# Patient Record
Sex: Male | Born: 1965 | Race: Black or African American | Hispanic: No | Marital: Single | Smoking: Never smoker
Health system: Southern US, Community
[De-identification: ages and names within clinical notes are randomized; demographics above are authoritative.]

## PROBLEM LIST (undated history)

## (undated) DIAGNOSIS — F1020 Alcohol dependence, uncomplicated: Secondary | ICD-10-CM

## (undated) DIAGNOSIS — I10 Essential (primary) hypertension: Secondary | ICD-10-CM

## (undated) DIAGNOSIS — F419 Anxiety disorder, unspecified: Secondary | ICD-10-CM

## (undated) DIAGNOSIS — F32A Depression, unspecified: Secondary | ICD-10-CM

## (undated) DIAGNOSIS — F329 Major depressive disorder, single episode, unspecified: Secondary | ICD-10-CM

---

## 1999-06-10 ENCOUNTER — Emergency Department (HOSPITAL_COMMUNITY): Admission: EM | Admit: 1999-06-10 | Discharge: 1999-06-10 | Payer: Self-pay | Admitting: Emergency Medicine

## 1999-06-27 ENCOUNTER — Emergency Department (HOSPITAL_COMMUNITY): Admission: EM | Admit: 1999-06-27 | Discharge: 1999-06-27 | Payer: Self-pay | Admitting: Emergency Medicine

## 2000-02-06 ENCOUNTER — Emergency Department (HOSPITAL_COMMUNITY): Admission: EM | Admit: 2000-02-06 | Discharge: 2000-02-06 | Payer: Self-pay | Admitting: Emergency Medicine

## 2012-07-18 ENCOUNTER — Emergency Department (HOSPITAL_COMMUNITY): Payer: Self-pay

## 2012-07-18 ENCOUNTER — Emergency Department (HOSPITAL_COMMUNITY)
Admission: EM | Admit: 2012-07-18 | Discharge: 2012-07-18 | Disposition: A | Discharge status: Home / Self Care | Payer: Self-pay | Attending: Emergency Medicine | Admitting: Emergency Medicine

## 2012-07-18 ENCOUNTER — Encounter (HOSPITAL_COMMUNITY): Payer: Self-pay | Admitting: *Deleted

## 2012-07-18 DIAGNOSIS — Y9239 Other specified sports and athletic area as the place of occurrence of the external cause: Secondary | ICD-10-CM | POA: Insufficient documentation

## 2012-07-18 DIAGNOSIS — F17200 Nicotine dependence, unspecified, uncomplicated: Secondary | ICD-10-CM | POA: Insufficient documentation

## 2012-07-18 DIAGNOSIS — X58XXXA Exposure to other specified factors, initial encounter: Secondary | ICD-10-CM | POA: Insufficient documentation

## 2012-07-18 DIAGNOSIS — Y92838 Other recreation area as the place of occurrence of the external cause: Secondary | ICD-10-CM | POA: Insufficient documentation

## 2012-07-18 DIAGNOSIS — Y9367 Activity, basketball: Secondary | ICD-10-CM | POA: Insufficient documentation

## 2012-07-18 DIAGNOSIS — M25469 Effusion, unspecified knee: Secondary | ICD-10-CM | POA: Insufficient documentation

## 2012-07-18 DIAGNOSIS — M25461 Effusion, right knee: Secondary | ICD-10-CM

## 2012-07-18 LAB — GLUCOSE, CAPILLARY: Glucose-Capillary: 97 mg/dL (ref 70–99)

## 2012-07-18 MED ORDER — OXYCODONE-ACETAMINOPHEN 5-325 MG PO TABS: 1.0000 | ORAL_TABLET | ORAL | Status: DC | PRN

## 2012-07-18 MED ORDER — OXYCODONE-ACETAMINOPHEN 5-325 MG PO TABS
2.0000 | ORAL_TABLET | Freq: Once | ORAL | Status: AC
  Administered 2012-07-18: 2 via ORAL
  Filled 2012-07-18: qty 2

## 2012-07-18 NOTE — Progress Notes (Signed)
Orthopedic Tech Progress Note Patient Details:  Jeffrey Moyer 12-08-1965 [id number]  Ortho Devices Type of Ortho Device: Knee Sleeve;Crutches Ortho Device/Splint Interventions: Application   Jeffrey Moyer 07/18/2012, 11:08 AM

## 2012-07-18 NOTE — ED Notes (Signed)
Right knee swollen and painful.  PT states he can't bend his knee without pain.

## 2012-07-18 NOTE — ED Notes (Signed)
Pt complaining of right knee being painful and swollen sine playing basketball 3 days ago.  Pt advises he's always had problems with same, but it never swells.

## 2012-07-18 NOTE — ED Notes (Signed)
Patient transported to X-ray 

## 2012-07-18 NOTE — ED Notes (Signed)
states feels jittery from meds

## 2012-07-18 NOTE — ED Provider Notes (Signed)
History     CSN: [id number]  Arrival date & time 07/18/12  0846   First MD Initiated Contact with Patient 07/18/12 [phone]      Chief Complaint  Patient presents with  . Knee Pain    (Consider location/radiation/quality/duration/timing/severity/associated sxs/prior treatment) HPI Comments: Patient is a 47 year old male who presents with a 3 day history of right knee pain. Pain started gradually after playing basketball 3 days ago and progressively worsened. The pain is throbbing and severe without radiation. Patient has tried OTC pain medication without relief. Movement and weight bearing activity make the pain worse. Nothing makes the pain better. Patient reports associated swelling.   Patient is a 47 y.o. male presenting with knee pain.  Knee Pain   History reviewed. No pertinent past medical history.  History reviewed. No pertinent past surgical history.  No family history on file.  History  Substance Use Topics  . Smoking status: Current Every Day Smoker -- 0.50 packs/day    Types: Cigarettes  . Smokeless tobacco: Not on file  . Alcohol Use: Yes     Comment: 40 daily      Review of Systems  Musculoskeletal: Positive for joint swelling and arthralgias.  All other systems reviewed and are negative.    Allergies  Review of patient's allergies indicates no known allergies.  Home Medications   Current Outpatient Rx  Name  Route  Sig  Dispense  Refill  . naproxen sodium (ANAPROX) 220 MG tablet   Oral   Take 440 mg by mouth daily as needed.           BP 152/92  Temp(Src) 97.8 F (36.6 C) (Oral)  Ht 5\' 10"  (1.778 m)  Wt 206 lb (93.441 kg)  BMI 29.56 kg/m2  SpO2 100%  Physical Exam  Nursing note and vitals reviewed. Constitutional: He is oriented to person, place, and time. He appears well-developed and well-nourished. No distress.  HENT:  Head: Normocephalic and atraumatic.  Eyes: Conjunctivae are normal.  Neck: Normal range of motion.   Cardiovascular: Normal rate and regular rhythm.  Exam reveals no gallop and no friction rub.   No murmur heard. Pulmonary/Chest: Effort normal and breath sounds normal. He has no wheezes. He has no rales. He exhibits no tenderness.  Abdominal: Soft. He exhibits no distension. There is no tenderness. There is no rebound.  Musculoskeletal: Normal range of motion.  Right knee ROM limited due to pain and swelling. Right knee generalized tenderness to palpation. No obvious deformity.   Neurological: He is alert and oriented to person, place, and time. Coordination normal.  Speech is goal-oriented. Moves limbs without ataxia.   Skin: Skin is warm and dry.  Psychiatric: He has a normal mood and affect. His behavior is normal.    ED Course  Procedures (including critical care time)  Labs Reviewed - No data to display Dg Knee Complete 4 Views Right  07/18/2012  *RADIOLOGY REPORT*  Clinical Data: Posterior knee pain and swelling.  RIGHT KNEE - COMPLETE 4+ VIEW  Comparison: None.  Findings: The patient has a moderate to large joint effusion.  No fracture, dislocation or focal bony lesion is identified.  Joint spaces appear preserved.  IMPRESSION: Moderate to large joint effusion.  Otherwise negative.   Original Report Authenticated By: Holley Dexter, M.D.      1. Knee effusion, right       MDM  10:33 AM Xray unremarkable for fracture or acute injury. Xray shows joint effusion. Patient given Percocet  for pain, crutches and knee sleeve. Patient will have recommended Orthopedic follow up. No neurovascular compromise.        Emilia Beck, PA-C 07/18/12 1533

## 2012-07-20 NOTE — ED Provider Notes (Signed)
Medical screening examination/treatment/procedure(s) were performed by non-physician practitioner and as supervising physician I was immediately available for consultation/collaboration.   Carleene Cooper III, MD 07/20/12 [phone]

## 2013-01-19 ENCOUNTER — Emergency Department (HOSPITAL_COMMUNITY)
Admission: EM | Admit: 2013-01-19 | Discharge: 2013-01-19 | Disposition: A | Discharge status: Home / Self Care | Payer: Self-pay | Attending: Emergency Medicine | Admitting: Emergency Medicine

## 2013-01-19 ENCOUNTER — Encounter (HOSPITAL_COMMUNITY): Payer: Self-pay | Admitting: Emergency Medicine

## 2013-01-19 DIAGNOSIS — W268XXA Contact with other sharp object(s), not elsewhere classified, initial encounter: Secondary | ICD-10-CM | POA: Insufficient documentation

## 2013-01-19 DIAGNOSIS — Y990 Civilian activity done for income or pay: Secondary | ICD-10-CM | POA: Insufficient documentation

## 2013-01-19 DIAGNOSIS — Y9389 Activity, other specified: Secondary | ICD-10-CM | POA: Insufficient documentation

## 2013-01-19 DIAGNOSIS — S91309A Unspecified open wound, unspecified foot, initial encounter: Secondary | ICD-10-CM | POA: Insufficient documentation

## 2013-01-19 DIAGNOSIS — S90852A Superficial foreign body, left foot, initial encounter: Secondary | ICD-10-CM

## 2013-01-19 DIAGNOSIS — Y9289 Other specified places as the place of occurrence of the external cause: Secondary | ICD-10-CM | POA: Insufficient documentation

## 2013-01-19 DIAGNOSIS — Z23 Encounter for immunization: Secondary | ICD-10-CM | POA: Insufficient documentation

## 2013-01-19 DIAGNOSIS — F17200 Nicotine dependence, unspecified, uncomplicated: Secondary | ICD-10-CM | POA: Insufficient documentation

## 2013-01-19 MED ORDER — CIPROFLOXACIN HCL 500 MG PO TABS: 500.0000 mg | ORAL_TABLET | Freq: Two times a day (BID) | ORAL | Status: DC

## 2013-01-19 MED ORDER — TETANUS-DIPHTHERIA TOXOIDS TD 5-2 LFU IM INJ
0.5000 mL | INJECTION | Freq: Once | INTRAMUSCULAR | Status: DC
  Filled 2013-01-19: qty 0.5

## 2013-01-19 MED ORDER — TETANUS-DIPHTH-ACELL PERTUSSIS 5-2.5-18.5 LF-MCG/0.5 IM SUSP
0.5000 mL | Freq: Once | INTRAMUSCULAR | Status: AC
  Administered 2013-01-19: 0.5 mL via INTRAMUSCULAR

## 2013-01-19 NOTE — ED Notes (Signed)
Pt refused x-ray.  Explained reasoning for x-ray and pt still refused to have it done.  Pt states he is going home and only needed tetanus shot.  Informed pt that he needed to wait to see PA and would need to have an antibiotic.  Pt verbalized understanding.

## 2013-01-19 NOTE — ED Provider Notes (Signed)
Medical screening examination/treatment/procedure(s) were performed by non-physician practitioner and as supervising physician I was immediately available for consultation/collaboration.  Darlys Gales, MD 01/19/13 1009

## 2013-01-19 NOTE — ED Notes (Signed)
PA went in to see pt and he had left.  Called pt's house and explained to him the importance of him getting an antibiotic.  Pt is on his way back to ED.

## 2013-01-19 NOTE — ED Notes (Signed)
Pt has returned.  Awaiting PA.

## 2013-01-19 NOTE — ED Provider Notes (Signed)
CSN: [id number]     Arrival date & time 01/19/13  0102 History   None    Chief Complaint  Patient presents with  . Foreign Body in Skin   (Consider location/radiation/quality/duration/timing/severity/associated sxs/prior Treatment) HPI History provided by pt.   Pt stepped on a nail at work today.  It went through the sole of his shoe and broke the skin of sole of forefoot. Minimal pain and is able to bear weight.  Hemostatic.  No associated sx.  Tetanus is not up to date.   History reviewed. No pertinent past medical history. History reviewed. No pertinent past surgical history. History reviewed. No pertinent family history. History  Substance Use Topics  . Smoking status: Current Every Day Smoker -- 0.50 packs/day    Types: Cigarettes  . Smokeless tobacco: Not on file  . Alcohol Use: Yes     Comment: 40 daily    Review of Systems  All other systems reviewed and are negative.    Allergies  Review of patient's allergies indicates no known allergies.  Home Medications   Current Outpatient Rx  Name  Route  Sig  Dispense  Refill  . ciprofloxacin (CIPRO) 500 MG tablet   Oral   Take 1 tablet (500 mg total) by mouth 2 (two) times daily.   14 tablet   0    BP 160/101  Pulse 104  Temp(Src) 98 F (36.7 C) (Oral)  Resp 20  SpO2 96% Physical Exam  Nursing note and vitals reviewed. Constitutional: He is oriented to person, place, and time. He appears well-developed and well-nourished. No distress.  HENT:  Head: Normocephalic and atraumatic.  Eyes:  Normal appearance  Neck: Normal range of motion.  Pulmonary/Chest: Effort normal.  Musculoskeletal: Normal range of motion.  Superficial puncture wound to left lateral forefoot.  Hemostatic and clean.  No edema.  No bony tenderness.  Full, active ROM of toes and distal sensation intact.  Neurological: He is alert and oriented to person, place, and time.  Psychiatric: He has a normal mood and affect. His behavior is normal.     ED Course  Procedures (including critical care time) Labs Review Labs Reviewed - No data to display Imaging Review No results found.  MDM   1. Foreign body in left foot, initial encounter    47yo M stepped on a nail today and it went through sole of boot, causing a superficial puncture wound to plantar surface of forefoot.  Hemostatic and clean and no bony tenderness.  Prescribed prophylactic cipro.  Tetanus updated.  Return precautions discussed. 4:27 AM     Otilio Miu, PA-C 01/19/13 0427  Otilio Miu, PA-C 01/19/13 [phone]

## 2013-01-19 NOTE — ED Notes (Signed)
L foot soaking in sterile water and betadine.

## 2013-01-19 NOTE — ED Notes (Signed)
Patient stepped on rusty nail this evening approx 90 mins ago.  Patient having pain in left foot.  No bleeding.

## 2013-02-09 ENCOUNTER — Encounter (HOSPITAL_COMMUNITY): Payer: Self-pay | Admitting: Emergency Medicine

## 2013-02-09 ENCOUNTER — Emergency Department (HOSPITAL_COMMUNITY)
Admission: EM | Admit: 2013-02-09 | Discharge: 2013-02-09 | Disposition: A | Discharge status: Home / Self Care | Payer: Self-pay | Attending: Emergency Medicine | Admitting: Emergency Medicine

## 2013-02-09 ENCOUNTER — Emergency Department (HOSPITAL_COMMUNITY): Payer: Self-pay

## 2013-02-09 DIAGNOSIS — M129 Arthropathy, unspecified: Secondary | ICD-10-CM | POA: Insufficient documentation

## 2013-02-09 DIAGNOSIS — S8000XA Contusion of unspecified knee, initial encounter: Secondary | ICD-10-CM | POA: Insufficient documentation

## 2013-02-09 DIAGNOSIS — S8392XA Sprain of unspecified site of left knee, initial encounter: Secondary | ICD-10-CM

## 2013-02-09 DIAGNOSIS — Y92410 Unspecified street and highway as the place of occurrence of the external cause: Secondary | ICD-10-CM | POA: Insufficient documentation

## 2013-02-09 DIAGNOSIS — S8002XA Contusion of left knee, initial encounter: Secondary | ICD-10-CM

## 2013-02-09 DIAGNOSIS — S6990XA Unspecified injury of unspecified wrist, hand and finger(s), initial encounter: Secondary | ICD-10-CM | POA: Insufficient documentation

## 2013-02-09 DIAGNOSIS — F17200 Nicotine dependence, unspecified, uncomplicated: Secondary | ICD-10-CM | POA: Insufficient documentation

## 2013-02-09 DIAGNOSIS — I10 Essential (primary) hypertension: Secondary | ICD-10-CM | POA: Insufficient documentation

## 2013-02-09 DIAGNOSIS — Y9389 Activity, other specified: Secondary | ICD-10-CM | POA: Insufficient documentation

## 2013-02-09 DIAGNOSIS — S59909A Unspecified injury of unspecified elbow, initial encounter: Secondary | ICD-10-CM | POA: Insufficient documentation

## 2013-02-09 DIAGNOSIS — Z792 Long term (current) use of antibiotics: Secondary | ICD-10-CM | POA: Insufficient documentation

## 2013-02-09 DIAGNOSIS — IMO0002 Reserved for concepts with insufficient information to code with codable children: Secondary | ICD-10-CM | POA: Insufficient documentation

## 2013-02-09 HISTORY — DX: Essential (primary) hypertension: I10

## 2013-02-09 MED ORDER — MUPIROCIN CALCIUM 2 % EX CREA: TOPICAL_CREAM | Freq: Three times a day (TID) | CUTANEOUS | Status: DC

## 2013-02-09 MED ORDER — HYDROCODONE-ACETAMINOPHEN 5-325 MG PO TABS: 1.0000 | ORAL_TABLET | Freq: Four times a day (QID) | ORAL | Status: DC | PRN
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
## 2013-02-09 NOTE — ED Provider Notes (Signed)
Medical screening examination/treatment/procedure(s) were performed by non-physician practitioner and as supervising physician I was immediately available for consultation/collaboration.   Audree Camel, MD 02/09/13 2330

## 2013-02-09 NOTE — ED Notes (Signed)
Pt escorted to discharge window. Pt verbalized understanding discharge instructions. In no acute distress.  
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
## 2013-02-13 ENCOUNTER — Encounter (HOSPITAL_COMMUNITY): Payer: Self-pay | Admitting: Emergency Medicine

## 2013-02-13 ENCOUNTER — Emergency Department (HOSPITAL_COMMUNITY)
Admission: EM | Admit: 2013-02-13 | Discharge: 2013-02-13 | Disposition: A | Discharge status: Home / Self Care | Payer: Self-pay | Attending: Emergency Medicine | Admitting: Emergency Medicine

## 2013-02-13 DIAGNOSIS — F17200 Nicotine dependence, unspecified, uncomplicated: Secondary | ICD-10-CM | POA: Insufficient documentation

## 2013-02-13 DIAGNOSIS — S8392XD Sprain of unspecified site of left knee, subsequent encounter: Secondary | ICD-10-CM

## 2013-02-13 DIAGNOSIS — Y9389 Activity, other specified: Secondary | ICD-10-CM | POA: Insufficient documentation

## 2013-02-13 DIAGNOSIS — IMO0002 Reserved for concepts with insufficient information to code with codable children: Secondary | ICD-10-CM | POA: Insufficient documentation

## 2013-02-13 DIAGNOSIS — T148XXA Other injury of unspecified body region, initial encounter: Secondary | ICD-10-CM

## 2013-02-13 DIAGNOSIS — I10 Essential (primary) hypertension: Secondary | ICD-10-CM | POA: Insufficient documentation

## 2013-02-13 DIAGNOSIS — Z792 Long term (current) use of antibiotics: Secondary | ICD-10-CM | POA: Insufficient documentation

## 2013-02-13 DIAGNOSIS — M79672 Pain in left foot: Secondary | ICD-10-CM

## 2013-02-13 DIAGNOSIS — S8010XA Contusion of unspecified lower leg, initial encounter: Secondary | ICD-10-CM | POA: Insufficient documentation

## 2013-02-13 DIAGNOSIS — Y92410 Unspecified street and highway as the place of occurrence of the external cause: Secondary | ICD-10-CM | POA: Insufficient documentation
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
## 2013-02-14 NOTE — ED Provider Notes (Signed)
Medical screening examination/treatment/procedure(s) were performed by non-physician practitioner and as supervising physician I was immediately available for consultation/collaboration.   Hurschel Paynter S Makilah Dowda, MD 02/14/13 1306 

## 2014-03-26 ENCOUNTER — Encounter (HOSPITAL_COMMUNITY): Payer: Self-pay | Admitting: Emergency Medicine

## 2014-03-26 DIAGNOSIS — I10 Essential (primary) hypertension: Secondary | ICD-10-CM | POA: Insufficient documentation

## 2014-03-26 DIAGNOSIS — F1010 Alcohol abuse, uncomplicated: Secondary | ICD-10-CM | POA: Insufficient documentation

## 2014-03-26 DIAGNOSIS — F348 Other persistent mood [affective] disorders: Secondary | ICD-10-CM | POA: Insufficient documentation

## 2014-03-26 DIAGNOSIS — Z720 Tobacco use: Secondary | ICD-10-CM | POA: Insufficient documentation

## 2014-03-26 DIAGNOSIS — Z792 Long term (current) use of antibiotics: Secondary | ICD-10-CM | POA: Insufficient documentation

## 2014-03-26 LAB — CBC WITH DIFFERENTIAL/PLATELET
BASOS ABS: 0.1 10*3/uL (ref 0.0–0.1)
BASOS PCT: 1 % (ref 0–1)
EOS ABS: 0.1 10*3/uL (ref 0.0–0.7)
Eosinophils Relative: 1 % (ref 0–5)
HCT: 45 % (ref 39.0–52.0)
HEMOGLOBIN: 15.3 g/dL (ref 13.0–17.0)
Lymphocytes Relative: 47 % — ABNORMAL HIGH (ref 12–46)
Lymphs Abs: 3 10*3/uL (ref 0.7–4.0)
MCH: 29.7 pg (ref 26.0–34.0)
MCHC: 34 g/dL (ref 30.0–36.0)
MCV: 87.2 fL (ref 78.0–100.0)
MONOS PCT: 9 % (ref 3–12)
Monocytes Absolute: 0.6 10*3/uL (ref 0.1–1.0)
NEUTROS ABS: 2.7 10*3/uL (ref 1.7–7.7)
NEUTROS PCT: 42 % — AB (ref 43–77)
PLATELETS: 234 10*3/uL (ref 150–400)
RBC: 5.16 MIL/uL (ref 4.22–5.81)
RDW: 14.1 % (ref 11.5–15.5)
WBC: 6.5 10*3/uL (ref 4.0–10.5)

## 2014-03-26 LAB — COMPREHENSIVE METABOLIC PANEL
ALBUMIN: 4.4 g/dL (ref 3.5–5.2)
ALK PHOS: 94 U/L (ref 39–117)
ALT: 65 U/L — AB (ref 0–53)
ANION GAP: 16 — AB (ref 5–15)
AST: 65 U/L — AB (ref 0–37)
BILIRUBIN TOTAL: 0.3 mg/dL (ref 0.3–1.2)
BUN: 11 mg/dL (ref 6–23)
CHLORIDE: 96 meq/L (ref 96–112)
CO2: 23 mEq/L (ref 19–32)
Calcium: 9.4 mg/dL (ref 8.4–10.5)
Creatinine, Ser: 1.13 mg/dL (ref 0.50–1.35)
GFR calc Af Amer: 86 mL/min — ABNORMAL LOW (ref >90)
GFR calc non Af Amer: 74 mL/min — ABNORMAL LOW (ref >90)
Glucose, Bld: 113 mg/dL — ABNORMAL HIGH (ref 70–99)
POTASSIUM: 4.1 meq/L (ref 3.7–5.3)
SODIUM: 135 meq/L — AB (ref 137–147)
TOTAL PROTEIN: 8.1 g/dL (ref 6.0–8.3)

## 2014-03-26 LAB — RAPID URINE DRUG SCREEN, HOSP PERFORMED
AMPHETAMINES: NOT DETECTED
BARBITURATES: NOT DETECTED
Benzodiazepines: NOT DETECTED
COCAINE: NOT DETECTED
OPIATES: NOT DETECTED
TETRAHYDROCANNABINOL: NOT DETECTED

## 2014-03-26 LAB — ETHANOL: Alcohol, Ethyl (B): 315 mg/dL — ABNORMAL HIGH (ref 0–11)
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
## 2014-03-26 NOTE — ED Notes (Signed)
Personal belongings/clothes bagged and labelled at triage .
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
## 2014-03-26 NOTE — ED Notes (Signed)
Pt. requesting detox for his alcoholism ,  last drink today , denies suicidal ideation . Pt. left after speaking with nurse at triage .

## 2014-03-26 NOTE — ED Notes (Addendum)
Pt. left ER to smoke despite repeated explaining of nurses on hospital smoking policy .

## 2014-03-27 ENCOUNTER — Emergency Department (HOSPITAL_COMMUNITY)
Admission: EM | Admit: 2014-03-27 | Discharge: 2014-03-27 | Disposition: A | Discharge status: Home / Self Care | Payer: No Typology Code available for payment source | Attending: Emergency Medicine | Admitting: Emergency Medicine

## 2014-03-27 ENCOUNTER — Encounter (HOSPITAL_COMMUNITY): Payer: Self-pay | Admitting: *Deleted

## 2014-03-27 DIAGNOSIS — F1010 Alcohol abuse, uncomplicated: Secondary | ICD-10-CM

## 2014-03-27 HISTORY — DX: Depression, unspecified: F32.A

## 2014-03-27 HISTORY — DX: Anxiety disorder, unspecified: F41.9

## 2014-03-27 HISTORY — DX: Alcohol dependence, uncomplicated: F10.20

## 2014-03-27 HISTORY — DX: Major depressive disorder, single episode, unspecified: F32.9

## 2014-03-27 MED ORDER — LORAZEPAM 1 MG PO TABS: 0.0000 mg | ORAL_TABLET | Freq: Two times a day (BID) | ORAL | Status: DC

## 2014-03-27 MED ORDER — LORAZEPAM 1 MG PO TABS: 0.0000 mg | ORAL_TABLET | Freq: Four times a day (QID) | ORAL | Status: DC

## 2014-03-27 NOTE — ED Notes (Signed)
Meal Tray Delivered.  

## 2014-03-27 NOTE — ED Provider Notes (Signed)
CSN: [id number]     Arrival date & time 03/26/14  2153 History   First MD Initiated Contact with Patient 03/27/14 0006     Chief Complaint  Patient presents with  . Alcohol Problem     (Consider location/radiation/quality/duration/timing/severity/associated sxs/prior Treatment) Patient is a 48 y.o. male presenting with alcohol problem.  Alcohol Problem   Resents requesting detox from alcohol. Denies other drug use.Jeffrey Moyer. Presents today as he pretty feels that his behavior is out of control.. He had vague thoughts of harming his mother yesterday however denies wanting to harm anyone or harm himself today. No treatment prior to coming here.he admits to feeling paranoid for several weeks. No other associated symptoms. Past Medical History  Diagnosis Date  . Hypertension   . Alcoholism    History reviewed. No pertinent past surgical history. Family History  Problem Relation Age of Onset  . Diabetes Mother   . Diabetes Father    History  Substance Use Topics  . Smoking status: Current Every Day Smoker -- 0.50 packs/day    Types: Cigarettes  . Smokeless tobacco: Not on file  . Alcohol Use: Yes     Comment: 40 daily    Review of Systems  Constitutional: Negative.   HENT: Negative.   Respiratory: Negative.   Cardiovascular: Negative.   Gastrointestinal: Negative.   Musculoskeletal: Negative.   Skin: Negative.   Neurological: Negative.   Psychiatric/Behavioral: Positive for dysphoric mood.  All other systems reviewed and are negative.     Allergies  Review of patient's allergies indicates no known allergies.  Home Medications   Prior to Admission medications   Medication Sig Start Date End Date Taking? Authorizing Provider  ciprofloxacin (CIPRO) 500 MG tablet Take 1 tablet (500 mg total) by mouth 2 (two) times daily. 01/19/13   Arie Sabinaatherine E Schinlever, PA-C  HYDROcodone-acetaminophen (NORCO) 5-325 MG per tablet Take 1 tablet by mouth every 6 (six) hours as needed. 02/09/13    Tatyana A Kirichenko, PA-C  ibuprofen (ADVIL,MOTRIN) 200 MG tablet Take 600 mg by mouth every 6 (six) hours as needed for pain.    Historical Provider, MD  mupirocin cream (BACTROBAN) 2 % Apply topically 3 (three) times daily. 02/09/13   Tatyana A Kirichenko, PA-C   BP 152/78 mmHg  Pulse 87  Temp(Src) 98.7 F (37.1 C) (Oral)  SpO2 99% Physical Exam  Constitutional: He appears well-developed and well-nourished.  HENT:  Head: Normocephalic and atraumatic.  Eyes: Conjunctivae are normal. Pupils are equal, round, and reactive to light.  Neck: Neck supple. No tracheal deviation present. No thyromegaly present.  Cardiovascular: Normal rate and regular rhythm.   No murmur heard. Pulmonary/Chest: Effort normal and breath sounds normal.  Abdominal: Soft. Bowel sounds are normal. He exhibits no distension. There is no tenderness.  Musculoskeletal: Normal range of motion. He exhibits no edema or tenderness.  Neurological: He is alert. Coordination normal.  Skin: Skin is warm and dry. No rash noted.  Psychiatric: He has a normal mood and affect.  Nursing note and vitals reviewed.   ED Course  Procedures (including critical care time) Labs Review Labs Reviewed  ETHANOL - Abnormal; Notable for the following:    Alcohol, Ethyl (B) 315 (*)    All other components within normal limits  CBC WITH DIFFERENTIAL - Abnormal; Notable for the following:    Neutrophils Relative % 42 (*)    Lymphocytes Relative 47 (*)    All other components within normal limits  COMPREHENSIVE METABOLIC PANEL - Abnormal; Notable for  the following:    Sodium 135 (*)    Glucose, Bld 113 (*)    AST 65 (*)    ALT 65 (*)    GFR calc non Af Amer 74 (*)    GFR calc Af Amer 86 (*)    Anion gap 16 (*)    All other components within normal limits  URINE RAPID DRUG SCREEN (HOSP PERFORMED)    Imaging Review No results found.   EKG Interpretation None     Results for orders placed or performed during the hospital  encounter of 03/27/14  Ethanol  Result Value Ref Range   Alcohol, Ethyl (B) 315 (H) 0 - 11 mg/dL  Drug screen panel, emergency  Result Value Ref Range   Opiates NONE DETECTED NONE DETECTED   Cocaine NONE DETECTED NONE DETECTED   Benzodiazepines NONE DETECTED NONE DETECTED   Amphetamines NONE DETECTED NONE DETECTED   Tetrahydrocannabinol NONE DETECTED NONE DETECTED   Barbiturates NONE DETECTED NONE DETECTED  CBC with Differential  Result Value Ref Range   WBC 6.5 4.0 - 10.5 K/uL   RBC 5.16 4.22 - 5.81 MIL/uL   Hemoglobin 15.3 13.0 - 17.0 g/dL   HCT 16.145.0 09.639.0 - 04.552.0 %   MCV 87.2 78.0 - 100.0 fL   MCH 29.7 26.0 - 34.0 pg   MCHC 34.0 30.0 - 36.0 g/dL   RDW 40.914.1 81.111.5 - 91.415.5 %   Platelets 234 150 - 400 K/uL   Neutrophils Relative % 42 (L) 43 - 77 %   Neutro Abs 2.7 1.7 - 7.7 K/uL   Lymphocytes Relative 47 (H) 12 - 46 %   Lymphs Abs 3.0 0.7 - 4.0 K/uL   Monocytes Relative 9 3 - 12 %   Monocytes Absolute 0.6 0.1 - 1.0 K/uL   Eosinophils Relative 1 0 - 5 %   Eosinophils Absolute 0.1 0.0 - 0.7 K/uL   Basophils Relative 1 0 - 1 %   Basophils Absolute 0.1 0.0 - 0.1 K/uL  Comprehensive metabolic panel  Result Value Ref Range   Sodium 135 (L) 137 - 147 mEq/L   Potassium 4.1 3.7 - 5.3 mEq/L   Chloride 96 96 - 112 mEq/L   CO2 23 19 - 32 mEq/L   Glucose, Bld 113 (H) 70 - 99 mg/dL   BUN 11 6 - 23 mg/dL   Creatinine, Ser 7.821.13 0.50 - 1.35 mg/dL   Calcium 9.4 8.4 - 95.610.5 mg/dL   Total Protein 8.1 6.0 - 8.3 g/dL   Albumin 4.4 3.5 - 5.2 g/dL   AST 65 (H) 0 - 37 U/L   ALT 65 (H) 0 - 53 U/L   Alkaline Phosphatase 94 39 - 117 U/L   Total Bilirubin 0.3 0.3 - 1.2 mg/dL   GFR calc non Af Amer 74 (L) >90 mL/min   GFR calc Af Amer 86 (L) >90 mL/min   Anion gap 16 (H) 5 - 15   No results found.  MDM  TTS called to evaluate patient for detox Diagnosis alcohol abuse Final diagnoses:  None        Doug SouSam Cebert Dettmann, MD 03/27/14 [phone]

## 2014-03-27 NOTE — Discharge Instructions (Signed)
°Emergency Department Resource Guide °1) Find a Doctor and Pay Out of Pocket °Although you won't have to find out who is covered by your insurance plan, it is a good idea to ask around and get recommendations. You will then need to call the office and see if the doctor you have chosen will accept you as a new patient and what types of options they offer for patients who are self-pay. Some doctors offer discounts or will set up payment plans for their patients who do not have insurance, but you will need to ask so you aren't surprised when you get to your appointment. ° °2) Contact Your Local Health Department °Not all health departments have doctors that can see patients for sick visits, but many do, so it is worth a call to see if yours does. If you don't know where your local health department is, you can check in your phone book. The CDC also has a tool to help you locate your state's health department, and many state websites also have listings of all of their local health departments. ° °3) Find a Walk-in Clinic °If your illness is not likely to be very severe or complicated, you may want to try a walk in clinic. These are popping up all over the country in pharmacies, drugstores, and shopping centers. They're usually staffed by nurse practitioners or physician assistants that have been trained to treat common illnesses and complaints. They're usually fairly quick and inexpensive. However, if you have serious medical issues or chronic medical problems, these are probably not your best option. ° °No Primary Care Doctor: °- Call [facility] at  832-8000 - they can help you locate a primary care doctor that  accepts your insurance, provides certain services, etc. °- Physician Referral Service- 1-800-533-3463 ° °Chronic Pain Problems: °Organization         Address  Phone   Notes  °[facility]  (336) 297-2271 Patients need to be referred by their primary care doctor.  ° °Medication  Assistance: °Organization         Address  Phone   Notes  °[place] [facility] [address] °[place], [place] [address] (336) 641-8030 --Must be a resident of [place] °-- Must have NO insurance coverage whatsoever (no Medicaid/ Medicare, etc.) °-- The pt. MUST have a primary care doctor that directs their care regularly and follows them in the community °  °[facility]  (866) 331-1348   °[employer]  (888) 892-1162   ° °Agencies that provide inexpensive medical care: °Organization         Address  Phone   Notes  °[facility]  (336) 832-8035   °[facility]    (336) 832-7272   °[facility] [address] °[place], [place] [address] (336) 832-4777   °[facility] [address], °[place] (336) 271-4999   °[facility]    (336) 373-0678   °[facility]    (336) 272-1050   °[facility] ° [address], [place] Phone:  (336) 832-4444, Fax:  (336) 832-4440 Hours of Operation:  9 am - 6 pm, M-F.  Also accepts Medicaid/Medicare and self-pay.  °[facility] ° [address], [place] Phone: (336) [phone], Fax: (336) [phone]. Hours of Operation:  8:30 am - 5:30 pm, M-F.  Also accepts Medicaid and self-pay.  °[facility] [place] [address], [place] Phone: (336) 878-6027   °[facility] [address], [place], [place] (336)723-1848, Ext. 123 Mondays & Thursdays: 7-9 AM.  First 15 patients are seen on a first come, first serve basis. °  ° °Medicaid-accepting [place] Providers: ° °Organization         Address  Phone   Notes  °[facility] [address], [place] (336) 641-2100 Also accepts self-pay patients.  °[facility] [address], [place] ° (336) 856-9996   °[facility] [address], [place]  (336) 288-8857   °[facility] [address] [place] [address], [place] (336) 299-7000   °Veita Bland [address], [place]  ° (336) 373-1557 Only accepts [place] Access Medicaid patients after they have their name applied to their card.  ° °Self-Pay (no insurance) in [place]: ° °Organization         Address  Phone   Notes  °Sickle Cell Patients, [facility] [address], [place] (336) 832-1970   °[facility] [address], [place] (336) 832-4400   ° [facility] ° [address], [place] (336) 992-4800   °[facility]/Dr. Osei-Bonsu ° [address] [place] [address], [place] or [address], [place] (336) [phone] Phone number for both [place] and [place] locations is the same.  °[facility] [address], [place] (336) 299-0000   °[facility] [address] [place] [address], [place] or [address] (336) 852-7530 °(336) 878-2260   °[facility] [address], [place] (336) 350-1642, phone; (336) 294-5005, fax Sees patients 1st and 3rd Saturday of every month.  Must not qualify for public or private insurance (i.e. Medicaid, Medicare, [place] Health Choice, Veterans' Benefits) • Household income should be no more than 200% of the poverty level •The clinic cannot treat you if you are pregnant or think you are pregnant • Sexually transmitted diseases are not treated at the clinic.  ° ° °Dental Care: °Organization         Address  Phone  Notes  °[place] [facility] [address], [place] (336) 641-6152 Accepts children up to age 21 who are enrolled in Medicaid or [place] Health Choice; pregnant women with a Medicaid card; and children who have applied for Medicaid or [place] Health Choice, but were declined, whose parents can pay a reduced fee at time of service.  °[place]  [facility] [place]  [address], [place] (336) 641-7733 Accepts children up to age 21 who are enrolled in Medicaid or [place] Health Choice; pregnant women with a Medicaid card; and children who have applied for Medicaid or [place] Health Choice, but were declined, whose parents can pay a reduced fee at time of service.  °[facility] ° [address], [place] (336) 641-4533 Patients are seen by appointment only. Walk-ins are not accepted. [facility] will see patients 18 years of age and older. °Monday - Tuesday (8am-5pm) °Most Wednesdays (8:30-5pm) °$30 per visit, cash only  °[facility] ° [address], [place] (336) 641-4533 Patients are seen by appointment only. Walk-ins are not accepted. [facility] will see patients 18 years of age and older. °One   Wednesday Evening (Monthly: Volunteer Based).  $30 per visit, cash only  °[facility]  (919) 537-3737 for adults; Children under age 4, call Graduate Pediatric Dentistry at ([phone]) [phone]. Children aged 4-14, please call ([phone]) [phone] to request a pediatric application. ° Dental services are provided in all areas of dental care including fillings, crowns and bridges, complete and partial dentures, implants, gum treatment, root canals, and extractions. Preventive care is also provided. Treatment is provided to both adults and children. °Patients are selected via a lottery and there is often a waiting list. °  °[facility] [address], °[place] ° (336) 763-8833 [email or web address] °  °[facility] [address], [place], [place] (336)723-1848, Ext. 123 Second and Fourth Thursday of each month, opens at 6:30 AM; Clinic ends at 9 AM.  Patients are seen on a first-come first-served basis, and a limited number are seen during each clinic.  ° °[facility] ° [address], [place], [place] (336) 723-7904    Eligibility Requirements °You must have lived in [place], [place], or [place] counties for at least the last three months. °  You cannot be eligible for state or federal sponsored healthcare insurance, including Veterans Administration, Medicaid, or Medicare. °  You generally cannot be eligible for healthcare insurance through your employer.  °  How to apply: °Eligibility screenings are held every Tuesday and Wednesday afternoon from 1:00 pm until 4:00 pm. You do not need an appointment for the interview!  °[facility] [address], [place], [place] 336-631-2330   °[facility]  336-342-8273   °[facility]  336-703-3100   °[facility]  336-570-6415   ° °Behavioral Health Resources in the Community: °Intensive Outpatient Programs °Organization         Address  Phone  Notes  °[facility] [address], [place], [place] 336-878-6098   °[facility] [address], [place], [place] 336-832-9800   °[facility] [address], [place], [place] ° 336-882-2125   °[facility] [address],  °[place], [place] 1-800-853-5163 or 336-641-4981   °Substance Abuse Resources °Organization         Address  Phone  Notes  °[facility]  336-882-2125   °[facility]  336-784-9470   °[facility]  336-285-9073   °[facility]  336-845-3988   °Residential & Outpatient Substance Abuse Program  1-800-659-3381   °Psychological Services °Organization         Address  Phone  Notes  °[facility]  336- 832-9600   °[facility]  336- 378-7881   °[facility] [address], [place] 1-800-853-5163 or 336-641-4981   ° °Mobile Crisis Teams °Organization         Address  Phone  Notes  °[facility], Mobile Crisis Care Unit  1-877-626-1772   °Assertive °[facility] ° [address]  [place], [place] 336-834-9664   °Sharon DeEsch [address] °[place] 336-554-5454   ° °Self-Help/Support Groups °Organization         Address  Phone             Notes  °[facility]  - variety of support groups  336- 373-1402 Call for more information  °Narcotics Anonymous (NA), [facility] [address], °[place]  2 meetings at this location  ° °  Residential Treatment Programs °Organization         Address  Phone  Notes  °[facility] [address],    °[place]  1-866-801-8205   °[facility] ° [address], [place], [place] 704-293-8524   °[facility] [address], [place] 336-845-3988 Admissions: 8am-3pm M-F  °[facility] [address]    °[place], [place] 336-841-1104   °[facility] [address], [place], [place] 336-379-7146   °[facility] [address]  °[place], [place] 336-285-9073   °[facility] - Intensive Outpatient [address], [place], [place] 336-852-3033   °[facility] [address]  °[place], [place] 1-877-615-2722 or 336-784-9470   °[facility] [address] [place], [place] 336-227-7417 Accepts Medicaid  °[facility] [address]  °[place] 1-800-659-3381 Substance Abuse/Addiction Treatment  ° °[place] Behavioral Health Resources °Organization         Address  Phone  Notes  °[facility]  (888) 581-9988   °Julie Brannon, PhD [address] [place], [place]   (336) 349-5553 or (336) 951-0000   °[facility]   [address] °[place], [place] (336) 349-4454   °[facility] [address], [place], [place] (336) 342-8316 Insurance/Medicaid/sponsorship through [facility]  °[facility] [address]                                    [place], [place] (336) 342-8316 Therapy/tele-psych/case    °[facility] [address]  ° [place], [place] (336) 349-2233    °Dr. Arfeen  (336) 349-4544   °[facility] [place]  [employer] [place] [facility] 1) [address],  °2) [address], [place] °3)  [address], [place] (336) 349-3220 °(336) 342-7768 ° °(336) 342-8140   °[place] [facility] (336) 342-1394 or (336) 342-3537 (After Hours)    ° ° °

## 2014-03-27 NOTE — ED Notes (Signed)
Dr. Jacubawitz at bedside. Pt requesting detox.  

## 2014-03-27 NOTE — ED Notes (Signed)
Belongings returned to pt. Including wallet and keys.

## 2014-03-27 NOTE — ED Provider Notes (Signed)
According to evaluation team, the patient does not meet inpatient criteria, he has a court date today, he has resources at home to pursue help with his alcohol abuse.  Jeffrey RollerBrian D Hommer Cunliffe, MD 03/27/14 [phone]

## 2014-03-27 NOTE — ED Notes (Signed)
Dr. J at bedside 

## 2014-03-27 NOTE — ED Notes (Signed)
TTS at bedside. 
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
## 2017-07-14 ENCOUNTER — Emergency Department (HOSPITAL_COMMUNITY)
Admission: EM | Admit: 2017-07-14 | Discharge: 2017-07-14 | Disposition: A | Discharge status: Home / Self Care | Payer: Self-pay | Attending: Emergency Medicine | Admitting: Emergency Medicine

## 2017-07-14 ENCOUNTER — Encounter (HOSPITAL_COMMUNITY): Payer: Self-pay | Admitting: Emergency Medicine

## 2017-07-14 ENCOUNTER — Other Ambulatory Visit: Payer: Self-pay

## 2017-07-14 ENCOUNTER — Emergency Department (HOSPITAL_COMMUNITY): Payer: Self-pay

## 2017-07-14 DIAGNOSIS — I10 Essential (primary) hypertension: Secondary | ICD-10-CM | POA: Insufficient documentation

## 2017-07-14 DIAGNOSIS — M25462 Effusion, left knee: Secondary | ICD-10-CM | POA: Insufficient documentation

## 2017-07-14 DIAGNOSIS — M25562 Pain in left knee: Secondary | ICD-10-CM | POA: Insufficient documentation

## 2017-07-14 DIAGNOSIS — Z79899 Other long term (current) drug therapy: Secondary | ICD-10-CM | POA: Insufficient documentation

## 2017-07-14 MED ORDER — NAPROXEN 500 MG PO TABS: 500.0000 mg | ORAL_TABLET | Freq: Two times a day (BID) | ORAL | 0 refills | Status: DC

## 2017-07-14 NOTE — ED Notes (Signed)
Updated pt. On plan of care. All questions answered

## 2017-07-14 NOTE — Progress Notes (Signed)
Orthopedic Tech Progress Note Patient Details:  Jeffrey Moyer 1965-06-19 [id number]  Ortho Devices Type of Ortho Device: Knee Immobilizer, Crutches Ortho Device/Splint Location: left Ortho Device/Splint Interventions: Application, Adjustment   Post Interventions Patient Tolerated: Well, Ambulated well Instructions Provided: Adjustment of device, Care of device, Poper ambulation with device   Jeffrey Moyer, Jeffrey Moyer 07/14/2017, 12:49 PM

## 2017-07-14 NOTE — ED Notes (Signed)
Called ortho tech 

## 2017-07-14 NOTE — Discharge Instructions (Signed)
ICE and elevate your leg. Stay off of it for several days. ACE wrap for compression. Naprosyn for pain and inflammation. Knee immobilizer as needed. Follow up with orthopedics if not improving by next week.
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
## 2017-07-14 NOTE — ED Notes (Signed)
Ortho tech at the bedside.  

## 2018-08-07 ENCOUNTER — Encounter (HOSPITAL_COMMUNITY): Payer: Self-pay

## 2018-08-07 ENCOUNTER — Emergency Department (HOSPITAL_COMMUNITY)
Admission: EM | Admit: 2018-08-07 | Discharge: 2018-08-07 | Disposition: A | Discharge status: Home / Self Care | Payer: Self-pay | Attending: Emergency Medicine | Admitting: Emergency Medicine

## 2018-08-07 ENCOUNTER — Other Ambulatory Visit: Payer: Self-pay

## 2018-08-07 DIAGNOSIS — Y9389 Activity, other specified: Secondary | ICD-10-CM | POA: Insufficient documentation

## 2018-08-07 DIAGNOSIS — I10 Essential (primary) hypertension: Secondary | ICD-10-CM | POA: Insufficient documentation

## 2018-08-07 DIAGNOSIS — Y929 Unspecified place or not applicable: Secondary | ICD-10-CM | POA: Insufficient documentation

## 2018-08-07 DIAGNOSIS — S0096XA Insect bite (nonvenomous) of unspecified part of head, initial encounter: Secondary | ICD-10-CM

## 2018-08-07 DIAGNOSIS — W57XXXA Bitten or stung by nonvenomous insect and other nonvenomous arthropods, initial encounter: Secondary | ICD-10-CM | POA: Insufficient documentation

## 2018-08-07 DIAGNOSIS — S0086XA Insect bite (nonvenomous) of other part of head, initial encounter: Secondary | ICD-10-CM | POA: Insufficient documentation

## 2018-08-07 DIAGNOSIS — F17210 Nicotine dependence, cigarettes, uncomplicated: Secondary | ICD-10-CM | POA: Insufficient documentation

## 2018-08-07 DIAGNOSIS — Y999 Unspecified external cause status: Secondary | ICD-10-CM | POA: Insufficient documentation

## 2018-08-07 MED ORDER — CEPHALEXIN 500 MG PO CAPS: 500.0000 mg | ORAL_CAPSULE | Freq: Four times a day (QID) | ORAL | 0 refills | Status: AC
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
## 2018-08-07 NOTE — Discharge Instructions (Addendum)
You may take 1 to 2 pills of Benadryl (25-50 mg total) every 6 hours.  I would recommend that you start by taking 2 pills of Benadryl.  If that does not improve your swelling then additional doses of Benadryl are unlikely to provide significant relief.  If your swelling does not start to improve after taking antibiotics for 2 days or your symptoms get significantly worse, including if you develop fevers, worsening swelling, difficulties breathing, speaking, or body wide rash please seek additional medical care and evaluation.  Please take Ibuprofen (Advil, motrin) and Tylenol (acetaminophen) to relieve your pain.  You may take up to 600 MG (3 pills) of normal strength ibuprofen every 8 hours as needed.  In between doses of ibuprofen you make take tylenol, up to 1,000 mg (two extra strength pills).  Do not take more than 3,000 mg tylenol in a 24 hour period.  Please check all medication labels as many medications such as pain and cold medications may contain tylenol.  Do not drink alcohol while taking these medications.  Do not take other NSAID'S while taking ibuprofen (such as aleve or naproxen).  Please take ibuprofen with food to decrease stomach upset.  You may have diarrhea from the antibiotics.  It is very important that you continue to take the antibiotics even if you get diarrhea unless a medical professional tells you that you may stop taking them.  If you stop too early the bacteria you are being treated for will become stronger and you may need different, more powerful antibiotics that have more side effects and worsening diarrhea.  Please stay well hydrated and consider probiotics as they may decrease the severity of your diarrhea.

## 2018-08-07 NOTE — ED Triage Notes (Signed)
Pt here for a bug bite to the face on Saturday.  Today woke up and had increased swelling.  No airway problems.  A&Ox4, ambulatory to triage.

## 2018-09-04 ENCOUNTER — Emergency Department (HOSPITAL_COMMUNITY)
Admission: EM | Admit: 2018-09-04 | Discharge: 2018-09-05 | Disposition: A | Discharge status: Home / Self Care | Payer: Self-pay | Attending: Emergency Medicine | Admitting: Emergency Medicine

## 2018-09-04 ENCOUNTER — Encounter (HOSPITAL_COMMUNITY): Payer: Self-pay | Admitting: Emergency Medicine

## 2018-09-04 ENCOUNTER — Other Ambulatory Visit: Payer: Self-pay

## 2018-09-04 DIAGNOSIS — J302 Other seasonal allergic rhinitis: Secondary | ICD-10-CM | POA: Insufficient documentation

## 2018-09-04 DIAGNOSIS — F17210 Nicotine dependence, cigarettes, uncomplicated: Secondary | ICD-10-CM | POA: Insufficient documentation

## 2018-09-04 DIAGNOSIS — I10 Essential (primary) hypertension: Secondary | ICD-10-CM | POA: Insufficient documentation

## 2018-09-04 DIAGNOSIS — Z79899 Other long term (current) drug therapy: Secondary | ICD-10-CM | POA: Insufficient documentation

## 2018-09-04 NOTE — ED Triage Notes (Signed)
Pt here to be evaluated for nasal congestion and a runny nose. Pt reports a history of allergies.

## 2018-09-04 NOTE — ED Notes (Signed)
Pt st's he has allergies.  St's nose has been running,  St's also has vomited x's 2 after eating month old slaw.

## 2018-09-05 NOTE — Discharge Instructions (Addendum)
Symptoms seem to be due to allergies.  You can try taking zyrtec, claritin, allegra, etc. The vomiting/diarrhea is likely from eating the expired cole slaw.  At this time it is felt unlikely that you have COVID, and only those requiring emergent hospitalization are being tested.  However if you have fever, shortness of breath, sore throat, etc. Then you will need to quarantine at home per [facility] guidelines below.     Person Under Monitoring Name: Jeffrey Moyer  Location: [address] [place] [address]   Infection Prevention Recommendations for Individuals Confirmed to have, or Being Evaluated for, 2019 Novel Coronavirus (COVID-19) Infection Who Receive Care at Home  Individuals who are confirmed to have, or are being evaluated for, COVID-19 should follow the prevention steps below until a healthcare provider or local or state health department says they can return to normal activities.  Stay home except to get medical care You should restrict activities outside your home, except for getting medical care. Do not go to work, school, or public areas, and do not use public transportation or taxis.  Call ahead before visiting your doctor Before your medical appointment, call the healthcare provider and tell them that you have, or are being evaluated for, COVID-19 infection. This will help the healthcare providers office take steps to keep other people from getting infected. Ask your healthcare provider to call the local or state health department.  Monitor your symptoms Seek prompt medical attention if your illness is worsening (e.g., difficulty breathing). Before going to your medical appointment, call the healthcare provider and tell them that you have, or are being evaluated for, COVID-19 infection. Ask your healthcare provider to call the local or state health department.  Wear a facemask You should wear a facemask that covers your nose and mouth when you are in the same  room with other people and when you visit a healthcare provider. People who live with or visit you should also wear a facemask while they are in the same room with you.  Separate yourself from other people in your home As much as possible, you should stay in a different room from other people in your home. Also, you should use a separate bathroom, if available.  Avoid sharing household items You should not share dishes, drinking glasses, cups, eating utensils, towels, bedding, or other items with other people in your home. After using these items, you should wash them thoroughly with soap and water.  Cover your coughs and sneezes Cover your mouth and nose with a tissue when you cough or sneeze, or you can cough or sneeze into your sleeve. Throw used tissues in a lined trash can, and immediately wash your hands with soap and water for at least 20 seconds or use an alcohol-based hand rub.  Wash your [employer] your hands often and thoroughly with soap and water for at least 20 seconds. You can use an alcohol-based hand sanitizer if soap and water are not available and if your hands are not visibly dirty. Avoid touching your eyes, nose, and mouth with unwashed hands.   Prevention Steps for Caregivers and Household Members of Individuals Confirmed to have, or Being Evaluated for, COVID-19 Infection Being Cared for in the Home  If you live with, or provide care at home for, a person confirmed to have, or being evaluated for, COVID-19 infection please follow these guidelines to prevent infection:  Follow healthcare providers instructions Make sure that you understand and can help the  patient follow any healthcare provider instructions for all care.  Provide for the patients basic needs You should help the patient with basic needs in the home and provide support for getting groceries, prescriptions, and other personal needs.  Monitor the patients symptoms If they are getting sicker,  call his or her medical provider and tell them that the patient has, or is being evaluated for, COVID-19 infection. This will help the healthcare providers office take steps to keep other people from getting infected. Ask the healthcare provider to call the local or state health department.  Limit the number of people who have contact with the patient If possible, have only one caregiver for the patient. Other household members should stay in another home or place of residence. If this is not possible, they should stay in another room, or be separated from the patient as much as possible. Use a separate bathroom, if available. Restrict visitors who do not have an essential need to be in the home.  Keep older adults, very young children, and other sick people away from the patient Keep older adults, very young children, and those who have compromised immune systems or chronic health conditions away from the patient. This includes people with chronic heart, lung, or kidney conditions, diabetes, and cancer.  Ensure good ventilation Make sure that shared spaces in the home have good air flow, such as from an air conditioner or an opened window, weather permitting.  Wash your hands often Wash your hands often and thoroughly with soap and water for at least 20 seconds. You can use an alcohol based hand sanitizer if soap and water are not available and if your hands are not visibly dirty. Avoid touching your eyes, nose, and mouth with unwashed hands. Use disposable paper towels to dry your hands. If not available, use dedicated cloth towels and replace them when they become wet.  Wear a facemask and gloves Wear a disposable facemask at all times in the room and gloves when you touch or have contact with the patients blood, body fluids, and/or secretions or excretions, such as sweat, saliva, sputum, nasal mucus, vomit, urine, or feces.  Ensure the mask fits over your nose and mouth tightly, and do  not touch it during use. Throw out disposable facemasks and gloves after using them. Do not reuse. Wash your hands immediately after removing your facemask and gloves. If your personal clothing becomes contaminated, carefully remove clothing and launder. Wash your hands after handling contaminated clothing. Place all used disposable facemasks, gloves, and other waste in a lined container before disposing them with other household waste. Remove gloves and wash your hands immediately after handling these items.  Do not share dishes, glasses, or other household items with the patient Avoid sharing household items. You should not share dishes, drinking glasses, cups, eating utensils, towels, bedding, or other items with a patient who is confirmed to have, or being evaluated for, COVID-19 infection. After the person uses these items, you should wash them thoroughly with soap and water.  Wash laundry thoroughly Immediately remove and wash clothes or bedding that have blood, body fluids, and/or secretions or excretions, such as sweat, saliva, sputum, nasal mucus, vomit, urine, or feces, on them. Wear gloves when handling laundry from the patient. Read and follow directions on labels of laundry or clothing items and detergent. In general, wash and dry with the warmest temperatures recommended on the label.  Clean all areas the individual has used often Clean all touchable surfaces, such  as counters, tabletops, doorknobs, bathroom fixtures, toilets, phones, keyboards, tablets, and bedside tables, every day. Also, clean any surfaces that may have blood, body fluids, and/or secretions or excretions on them. Wear gloves when cleaning surfaces the patient has come in contact with. Use a diluted bleach solution (e.g., dilute bleach with 1 part bleach and 10 parts water) or a household disinfectant with a label that says EPA-registered for coronaviruses. To make a bleach solution at home, add 1 tablespoon of  bleach to 1 quart (4 cups) of water. For a larger supply, add  cup of bleach to 1 gallon (16 cups) of water. Read labels of cleaning products and follow recommendations provided on product labels. Labels contain instructions for safe and effective use of the cleaning product including precautions you should take when applying the product, such as wearing gloves or eye protection and making sure you have good ventilation during use of the product. Remove gloves and wash hands immediately after cleaning.  Monitor yourself for signs and symptoms of illness Caregivers and household members are considered close contacts, should monitor their health, and will be asked to limit movement outside of the home to the extent possible. Follow the monitoring steps for close contacts listed on the symptom monitoring form.   ? If you have additional questions, contact your local health department or call the epidemiologist on call at [phone] (available 24/7). ? This guidance is subject to change. For the most up-to-date guidance from [facility], please refer to their website: TripMetro.huhttps://www.cdc.gov/coronavirus/2019-ncov/hcp/guidance-prevent-spread.html

## 2018-09-05 NOTE — ED Provider Notes (Signed)
[facility] EMERGENCY DEPARTMENT Provider Note   CSN: [id number] Arrival date & time: 09/04/18  2216    History   Chief Complaint Chief Complaint  Patient presents with  . Nasal Congestion  . Allergies    HPI Jeffrey Moyer is a 53 y.o. male.  53 y.o. M with history of anxiety, depression, HTN, presenting to the ED for work evaluation.  States he has been having a tough time with his allergies lately-- sneezing, rhinorrhea, itchy/watery eyes, etc.  States he works at a [employer] and they noticed his runny nose and were concerned.  He threw up at work today and they sent him home and wanted him tested for COVID.  He states he has thrown up twice and had some diarrhea but he realized he ate slaw that had been in his refridgerator for over a month.  States his girlfriend at this too and also had diarrhea.  Girlfriend works at a SN[employer] and was tested for COVID earlier this week due to outbreak at facility and she was negative.  They live in close quarters (1 bedroom apt).  He has not had any other sick contacts.  He denies any cough, chest pain, shortness of breath, fevers, etc.  States he feels fine but was required to come here for work.  The history is provided by the patient and medical records.       Past Medical History:  Diagnosis Date  . Alcoholism (HCC)   . Anxiety   . Depression   . Hypertension     There are no active problems to display for this patient.   History reviewed. No pertinent surgical history.      Home Medications    Prior to Admission medications   Medication Sig Start Date End Date Taking? Authorizing Provider  HYDROcodone-acetaminophen (NORCO) 5-325 MG per tablet Take 1 tablet by mouth every 6 (six) hours as needed. 02/09/13   Kirichenko, Tatyana, PA-C  ibuprofen (ADVIL,MOTRIN) 200 MG tablet Take 600 mg by mouth every 6 (six) hours as needed for pain.    [provider]  naproxen (NAPROSYN) 500 MG tablet Take 1  tablet (500 mg total) by mouth 2 (two) times daily. 07/14/17   Jaynie Crumble, PA-C    Family History Family History  Problem Relation Age of Onset  . Diabetes Mother   . Diabetes Father     Social History Social History   Tobacco Use  . Smoking status: Current Every Day Smoker    Packs/day: 0.50    Types: Cigarettes  . Smokeless tobacco: Never Used  Substance Use Topics  . Alcohol use: Yes    Comment: 40 daily  . Drug use: No     Allergies   Patient has no known allergies.   Review of Systems Review of Systems  HENT: Positive for sneezing.   All other systems reviewed and are negative.    Physical Exam Updated Vital Signs BP (!) 146/90   Pulse 81   Temp 98.4 F (36.9 C) (Oral)   Resp 18   Ht  (1.778 m)   Wt 90.7 kg   SpO2 98%   BMI 28.70 kg/m   Physical Exam Vitals signs and nursing note reviewed.  Constitutional:      Appearance: He is well-developed.  HENT:     Head: Normocephalic and atraumatic.     Right Ear: Tympanic membrane and ear canal normal.     Left Ear: Tympanic membrane and ear  canal normal.     Nose: Nose normal.     Mouth/Throat:     Lips: Pink.     Mouth: Mucous membranes are moist.     Pharynx: Oropharynx is clear.  Eyes:     Conjunctiva/sclera: Conjunctivae normal.     Pupils: Pupils are equal, round, and reactive to light.  Neck:     Musculoskeletal: Normal range of motion.  Cardiovascular:     Rate and Rhythm: Normal rate and regular rhythm.     Heart sounds: Normal heart sounds.  Pulmonary:     Effort: Pulmonary effort is normal.     Breath sounds: Normal breath sounds. No wheezing, rhonchi or rales.  Abdominal:     General: Bowel sounds are normal.     Palpations: Abdomen is soft.  Musculoskeletal: Normal range of motion.  Skin:    General: Skin is warm and dry.  Neurological:     Mental Status: He is alert and oriented to person, place, and time.      ED Treatments / Results  Labs (all labs  ordered are listed, but only abnormal results are displayed) Labs Reviewed - No data to display  EKG None  Radiology No results found.  Procedures Procedures (including critical care time)  Medications Ordered in ED Medications - No data to display   Initial Impression / Assessment and Plan / ED Course  I have reviewed the triage vital signs and the nursing notes.  Pertinent labs & imaging results that were available during my care of the patient were reviewed by me and considered in my medical decision making (see chart for details).  53 year old male here at insistence of his employer for COVID screening.  He has been having issues with allergies (sneezing, rhinorrhea, itchy/watery eyes) lately.  States this happens this time of year every single year.  He has not had any cough, chest pain, or SOB.  No fevers.  No sick contacts--girlfriend whom he lives with was tested earlier this week [facility] and was negative.  Patient was sent home today due to vomiting, however also ate cole slaw that had been in the refridgerator for over a month.  Girlfriend at this with similar symptoms.  Patient is afebrile, non-toxic in appearance here.  His exam is benign.  He remains without any current complaints.  He has had diarrhea a few times here, however feel this is likely related to his expired food intake.  No abdominal pain.  At this time, feel COVID is unlikely.  I have explained that only those requiring hospitalization/higher lever of care are being tested.  He can try taking allergy medication for sneezing/rhinorrhea.  He should continue to monitor symptoms closely and if new symptoms arise, especially fever, cough, or SOB, he should self quarantine per CDC guidelines.  Return here for any new/acute changes.  Jeffrey Moyer was evaluated in Emergency Department on 09/05/2018 for the symptoms described in the history of present illness. He was evaluated in the  context of the global COVID-19 pandemic, which necessitated consideration that the patient might be at risk for infection with the SARS-CoV-2 virus that causes COVID-19. Institutional protocols and algorithms that pertain to the evaluation of patients at risk for COVID-19 are in a state of rapid change based on information released by regulatory bodies including the CDC and federal and state organizations. These policies and algorithms were followed during the patient's care in the ED.   Final Clinical  Impressions(s) / ED Diagnoses   Final diagnoses:  Seasonal allergies    ED Discharge Orders    None       Garlon Hatchet, PA-C 09/05/18 0026    Zadie Rhine, MD 09/05/18 [phone]

## 2018-09-30 ENCOUNTER — Emergency Department (HOSPITAL_COMMUNITY): Payer: No Typology Code available for payment source

## 2018-09-30 ENCOUNTER — Other Ambulatory Visit: Payer: Self-pay

## 2018-09-30 ENCOUNTER — Emergency Department (HOSPITAL_COMMUNITY)
Admission: EM | Admit: 2018-09-30 | Discharge: 2018-09-30 | Discharge status: Against Medical Advice | Payer: No Typology Code available for payment source | Attending: Emergency Medicine | Admitting: Emergency Medicine

## 2018-09-30 ENCOUNTER — Encounter (HOSPITAL_COMMUNITY): Payer: Self-pay | Admitting: Emergency Medicine

## 2018-09-30 DIAGNOSIS — R220 Localized swelling, mass and lump, head: Secondary | ICD-10-CM | POA: Insufficient documentation

## 2018-09-30 DIAGNOSIS — Z23 Encounter for immunization: Secondary | ICD-10-CM | POA: Insufficient documentation

## 2018-09-30 DIAGNOSIS — S0081XA Abrasion of other part of head, initial encounter: Secondary | ICD-10-CM | POA: Diagnosis not present

## 2018-09-30 DIAGNOSIS — Y908 Blood alcohol level of 240 mg/100 ml or more: Secondary | ICD-10-CM | POA: Insufficient documentation

## 2018-09-30 DIAGNOSIS — Y9389 Activity, other specified: Secondary | ICD-10-CM | POA: Diagnosis not present

## 2018-09-30 DIAGNOSIS — S0511XA Contusion of eyeball and orbital tissues, right eye, initial encounter: Secondary | ICD-10-CM | POA: Diagnosis not present

## 2018-09-30 DIAGNOSIS — R6884 Jaw pain: Secondary | ICD-10-CM | POA: Diagnosis not present

## 2018-09-30 DIAGNOSIS — Y999 Unspecified external cause status: Secondary | ICD-10-CM | POA: Insufficient documentation

## 2018-09-30 DIAGNOSIS — Z5329 Procedure and treatment not carried out because of patient's decision for other reasons: Secondary | ICD-10-CM | POA: Insufficient documentation

## 2018-09-30 DIAGNOSIS — S60512A Abrasion of left hand, initial encounter: Secondary | ICD-10-CM | POA: Insufficient documentation

## 2018-09-30 DIAGNOSIS — S60511A Abrasion of right hand, initial encounter: Secondary | ICD-10-CM | POA: Insufficient documentation

## 2018-09-30 DIAGNOSIS — F10920 Alcohol use, unspecified with intoxication, uncomplicated: Secondary | ICD-10-CM | POA: Insufficient documentation

## 2018-09-30 DIAGNOSIS — S0993XA Unspecified injury of face, initial encounter: Secondary | ICD-10-CM | POA: Diagnosis present

## 2018-09-30 DIAGNOSIS — T07XXXA Unspecified multiple injuries, initial encounter: Secondary | ICD-10-CM

## 2018-09-30 DIAGNOSIS — S01531A Puncture wound without foreign body of lip, initial encounter: Secondary | ICD-10-CM | POA: Insufficient documentation

## 2018-09-30 DIAGNOSIS — Y92410 Unspecified street and highway as the place of occurrence of the external cause: Secondary | ICD-10-CM | POA: Diagnosis not present

## 2018-09-30 LAB — I-STAT CHEM 8, ED
BUN: 11 mg/dL (ref 6–20)
Calcium, Ion: 0.97 mmol/L — ABNORMAL LOW (ref 1.15–1.40)
Chloride: 107 mmol/L (ref 98–111)
Creatinine, Ser: 1.5 mg/dL — ABNORMAL HIGH (ref 0.61–1.24)
Glucose, Bld: 118 mg/dL — ABNORMAL HIGH (ref 70–99)
HCT: 48 % (ref 39.0–52.0)
Hemoglobin: 16.3 g/dL (ref 13.0–17.0)
Potassium: 3.9 mmol/L (ref 3.5–5.1)
Sodium: 139 mmol/L (ref 135–145)
TCO2: 19 mmol/L — ABNORMAL LOW (ref 22–32)

## 2018-09-30 LAB — URINALYSIS, ROUTINE W REFLEX MICROSCOPIC
Bilirubin Urine: NEGATIVE
Glucose, UA: NEGATIVE mg/dL
Ketones, ur: NEGATIVE mg/dL
Leukocytes,Ua: NEGATIVE
Nitrite: NEGATIVE
Protein, ur: NEGATIVE mg/dL
Specific Gravity, Urine: 1.011 (ref 1.005–1.030)
pH: 5 (ref 5.0–8.0)

## 2018-09-30 LAB — CBC
HCT: 44.1 % (ref 39.0–52.0)
Hemoglobin: 14.6 g/dL (ref 13.0–17.0)
MCH: 30.2 pg (ref 26.0–34.0)
MCHC: 33.1 g/dL (ref 30.0–36.0)
MCV: 91.1 fL (ref 80.0–100.0)
Platelets: 234 10*3/uL (ref 150–400)
RBC: 4.84 MIL/uL (ref 4.22–5.81)
RDW: 14.6 % (ref 11.5–15.5)
WBC: 5.9 10*3/uL (ref 4.0–10.5)
nRBC: 0 % (ref 0.0–0.2)

## 2018-09-30 LAB — COMPREHENSIVE METABOLIC PANEL
ALT: 95 U/L — ABNORMAL HIGH (ref 0–44)
AST: 158 U/L — ABNORMAL HIGH (ref 15–41)
Albumin: 4.1 g/dL (ref 3.5–5.0)
Alkaline Phosphatase: 79 U/L (ref 38–126)
Anion gap: 12 (ref 5–15)
BUN: 9 mg/dL (ref 6–20)
CO2: 20 mmol/L — ABNORMAL LOW (ref 22–32)
Calcium: 8.5 mg/dL — ABNORMAL LOW (ref 8.9–10.3)
Chloride: 104 mmol/L (ref 98–111)
Creatinine, Ser: 1.01 mg/dL (ref 0.61–1.24)
GFR calc Af Amer: >60 mL/min (ref >60)
GFR calc non Af Amer: >60 mL/min (ref >60)
Glucose, Bld: 122 mg/dL — ABNORMAL HIGH (ref 70–99)
Potassium: 3.9 mmol/L (ref 3.5–5.1)
Sodium: 136 mmol/L (ref 135–145)
Total Bilirubin: 0.7 mg/dL (ref 0.3–1.2)
Total Protein: 7.5 g/dL (ref 6.5–8.1)

## 2018-09-30 LAB — ETHANOL: Alcohol, Ethyl (B): 327 mg/dL (ref <10)

## 2018-09-30 LAB — SAMPLE TO BLOOD BANK

## 2018-09-30 LAB — LACTIC ACID, PLASMA: Lactic Acid, Venous: 2.3 mmol/L (ref 0.5–1.9)

## 2018-09-30 LAB — PROTIME-INR
INR: 0.9 (ref 0.8–1.2)
Prothrombin Time: 12.4 seconds (ref 11.4–15.2)

## 2018-09-30 MED ORDER — TETANUS-DIPHTH-ACELL PERTUSSIS 5-2.5-18.5 LF-MCG/0.5 IM SUSP
0.5000 mL | Freq: Once | INTRAMUSCULAR | Status: AC
  Administered 2018-09-30: 0.5 mL via INTRAMUSCULAR
  Filled 2018-09-30: qty 0.5

## 2018-09-30 MED ORDER — LIDOCAINE-EPINEPHRINE (PF) 2 %-1:200000 IJ SOLN
20.0000 mL | Freq: Once | INTRAMUSCULAR | Status: DC
  Filled 2018-09-30: qty 20

## 2018-09-30 NOTE — ED Notes (Addendum)
Returned from [employer] and CT. Refused hand xrays.

## 2018-09-30 NOTE — ED Notes (Signed)
Patient taken to CT and xray.

## 2018-09-30 NOTE — ED Notes (Signed)
Patient refusing to stay.  Patient given washcloths and towels to clean face.  Dr Criss Alvine notified.
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
## 2018-09-30 NOTE — ED Notes (Signed)
Pts friend Celso Sickle stopped by and would like an update if okayed by pt.  [phone]
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
## 2018-09-30 NOTE — ED Notes (Signed)
Patient ambulated to bathroom.

## 2018-09-30 NOTE — ED Notes (Signed)
Patient left AMA at this time.  He states that he does not want the stitches.  Patient walked out of ED, steady gait through exit.

## 2018-10-02 ENCOUNTER — Other Ambulatory Visit: Payer: Self-pay

## 2018-10-02 ENCOUNTER — Encounter (HOSPITAL_COMMUNITY): Payer: Self-pay | Admitting: Emergency Medicine

## 2018-10-02 ENCOUNTER — Emergency Department (HOSPITAL_COMMUNITY)
Admission: EM | Admit: 2018-10-02 | Discharge: 2018-10-02 | Disposition: A | Discharge status: Home / Self Care | Payer: No Typology Code available for payment source | Attending: Emergency Medicine | Admitting: Emergency Medicine

## 2018-10-02 ENCOUNTER — Emergency Department (HOSPITAL_COMMUNITY): Payer: No Typology Code available for payment source

## 2018-10-02 DIAGNOSIS — S0081XD Abrasion of other part of head, subsequent encounter: Secondary | ICD-10-CM | POA: Insufficient documentation

## 2018-10-02 DIAGNOSIS — L03113 Cellulitis of right upper limb: Secondary | ICD-10-CM | POA: Insufficient documentation

## 2018-10-02 DIAGNOSIS — L03119 Cellulitis of unspecified part of limb: Secondary | ICD-10-CM

## 2018-10-02 DIAGNOSIS — I10 Essential (primary) hypertension: Secondary | ICD-10-CM | POA: Diagnosis not present

## 2018-10-02 DIAGNOSIS — H109 Unspecified conjunctivitis: Secondary | ICD-10-CM | POA: Insufficient documentation

## 2018-10-02 DIAGNOSIS — T07XXXA Unspecified multiple injuries, initial encounter: Secondary | ICD-10-CM

## 2018-10-02 DIAGNOSIS — Z79899 Other long term (current) drug therapy: Secondary | ICD-10-CM | POA: Diagnosis not present

## 2018-10-02 DIAGNOSIS — H1089 Other conjunctivitis: Secondary | ICD-10-CM

## 2018-10-02 DIAGNOSIS — M79641 Pain in right hand: Secondary | ICD-10-CM | POA: Diagnosis present

## 2018-10-02 MED ORDER — CEPHALEXIN 500 MG PO CAPS: 500.0000 mg | ORAL_CAPSULE | Freq: Four times a day (QID) | ORAL | 0 refills | Status: AC

## 2018-10-02 MED ORDER — ERYTHROMYCIN 5 MG/GM OP OINT
1.0000 "application " | TOPICAL_OINTMENT | Freq: Once | OPHTHALMIC | Status: AC
  Administered 2018-10-02: 1 via OPHTHALMIC
  Filled 2018-10-02: qty 3.5
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
## 2018-10-02 NOTE — ED Notes (Signed)
Patient verbalizes understanding of discharge instructions. Opportunity for questioning and answers were provided. Armband removed by staff, pt discharged from ED.  
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
